# Patient Record
Sex: Male | Born: 1960 | Race: Black or African American | Hispanic: No | Marital: Married | Smoking: Former smoker
Health system: Southern US, Community
[De-identification: ages and names within clinical notes are randomized; demographics above are authoritative.]

## PROBLEM LIST (undated history)

## (undated) DIAGNOSIS — R0789 Other chest pain: Secondary | ICD-10-CM

## (undated) HISTORY — PX: SHOULDER SURGERY: SHX246

## (undated) HISTORY — DX: Other chest pain: R07.89

---

## 2001-06-02 HISTORY — PX: OTHER SURGICAL HISTORY: SHX169

## 2015-09-06 DIAGNOSIS — J111 Influenza due to unidentified influenza virus with other respiratory manifestations: Secondary | ICD-10-CM | POA: Diagnosis not present

## 2015-09-06 DIAGNOSIS — R509 Fever, unspecified: Secondary | ICD-10-CM | POA: Diagnosis not present

## 2017-02-13 ENCOUNTER — Emergency Department (HOSPITAL_BASED_OUTPATIENT_CLINIC_OR_DEPARTMENT_OTHER): Payer: BLUE CROSS/BLUE SHIELD

## 2017-02-13 ENCOUNTER — Emergency Department (HOSPITAL_BASED_OUTPATIENT_CLINIC_OR_DEPARTMENT_OTHER)
Admission: EM | Admit: 2017-02-13 | Discharge: 2017-02-13 | Disposition: A | Discharge status: Home / Self Care | Payer: BLUE CROSS/BLUE SHIELD | Attending: Emergency Medicine | Admitting: Emergency Medicine

## 2017-02-13 ENCOUNTER — Encounter (HOSPITAL_BASED_OUTPATIENT_CLINIC_OR_DEPARTMENT_OTHER): Payer: Self-pay | Admitting: *Deleted

## 2017-02-13 DIAGNOSIS — R1013 Epigastric pain: Secondary | ICD-10-CM

## 2017-02-13 DIAGNOSIS — F17200 Nicotine dependence, unspecified, uncomplicated: Secondary | ICD-10-CM | POA: Diagnosis not present

## 2017-02-13 DIAGNOSIS — R05 Cough: Secondary | ICD-10-CM | POA: Diagnosis not present

## 2017-02-13 DIAGNOSIS — R072 Precordial pain: Secondary | ICD-10-CM

## 2017-02-13 DIAGNOSIS — R079 Chest pain, unspecified: Secondary | ICD-10-CM | POA: Diagnosis not present

## 2017-02-13 DIAGNOSIS — R1010 Upper abdominal pain, unspecified: Secondary | ICD-10-CM | POA: Diagnosis not present

## 2017-02-13 DIAGNOSIS — K760 Fatty (change of) liver, not elsewhere classified: Secondary | ICD-10-CM | POA: Diagnosis not present

## 2017-02-13 LAB — CBC WITH DIFFERENTIAL/PLATELET
BASOS ABS: 0 10*3/uL (ref 0.0–0.1)
BASOS PCT: 0 %
EOS PCT: 5 %
Eosinophils Absolute: 0.2 10*3/uL (ref 0.0–0.7)
HCT: 38.4 % — ABNORMAL LOW (ref 39.0–52.0)
Hemoglobin: 13.1 g/dL (ref 13.0–17.0)
Lymphocytes Relative: 40 %
Lymphs Abs: 1.9 10*3/uL (ref 0.7–4.0)
MCH: 29.8 pg (ref 26.0–34.0)
MCHC: 34.1 g/dL (ref 30.0–36.0)
MCV: 87.3 fL (ref 78.0–100.0)
MONO ABS: 0.5 10*3/uL (ref 0.1–1.0)
Monocytes Relative: 11 %
Neutro Abs: 2 10*3/uL (ref 1.7–7.7)
Neutrophils Relative %: 44 %
PLATELETS: 244 10*3/uL (ref 150–400)
RBC: 4.4 MIL/uL (ref 4.22–5.81)
RDW: 13.1 % (ref 11.5–15.5)
WBC: 4.6 10*3/uL (ref 4.0–10.5)

## 2017-02-13 LAB — COMPREHENSIVE METABOLIC PANEL
ALT: 22 U/L (ref 17–63)
AST: 28 U/L (ref 15–41)
Albumin: 3.8 g/dL (ref 3.5–5.0)
Alkaline Phosphatase: 81 U/L (ref 38–126)
Anion gap: 5 (ref 5–15)
BILIRUBIN TOTAL: 0.6 mg/dL (ref 0.3–1.2)
BUN: 16 mg/dL (ref 6–20)
CO2: 27 mmol/L (ref 22–32)
Calcium: 9.3 mg/dL (ref 8.9–10.3)
Chloride: 105 mmol/L (ref 101–111)
Creatinine, Ser: 1.25 mg/dL — ABNORMAL HIGH (ref 0.61–1.24)
GFR calc Af Amer: >60 mL/min (ref >60)
GFR calc non Af Amer: >60 mL/min (ref >60)
Glucose, Bld: 130 mg/dL — ABNORMAL HIGH (ref 65–99)
POTASSIUM: 4.1 mmol/L (ref 3.5–5.1)
SODIUM: 137 mmol/L (ref 135–145)
TOTAL PROTEIN: 6.5 g/dL (ref 6.5–8.1)

## 2017-02-13 LAB — LIPASE, BLOOD: LIPASE: 30 U/L (ref 11–51)

## 2017-02-13 LAB — TROPONIN I

## 2017-02-13 MED ORDER — FAMOTIDINE 20 MG PO TABS
20.0000 mg | ORAL_TABLET | Freq: Once | ORAL | Status: AC
  Administered 2017-02-13: 20 mg via ORAL
  Filled 2017-02-13: qty 1

## 2017-02-13 MED ORDER — ACETAMINOPHEN 500 MG PO TABS
1000.0000 mg | ORAL_TABLET | Freq: Once | ORAL | Status: AC
Start: 2017-02-13 — End: 2017-02-13
  Administered 2017-02-13: 1000 mg via ORAL
  Filled 2017-02-13: qty 2

## 2017-02-13 MED ORDER — PANTOPRAZOLE SODIUM 40 MG PO TBEC: 40.0000 mg | DELAYED_RELEASE_TABLET | Freq: Every day | ORAL | 0 refills | Status: DC

## 2017-02-13 NOTE — ED Notes (Signed)
Awaiting US.

## 2017-02-13 NOTE — ED Provider Notes (Signed)
[facility] Provider Note   CSN: [id number] Arrival date & time: 02/13/17  7829     History   Chief Complaint Chief Complaint  Patient presents with  . Abdominal Pain    HPI Travis Chang is a 56 y.o. male.  Patient c/o upper abd, lower chest pain for the past 4 days. Pain constant, dull, moderate, occasionally radiates towards back. Occurs at rest. Is constant. If anything, worse w laying flat. No relation to eating. No relation to activity or exertion. Pain is in midline, not pleuritic. No associated nv or diaphoresis. Thoughts may be reflux related, tried tums without significant relief. Denies cough or uri c/o. No fever or chills. No chest wall injury or strain. No associated sob or unusual doe. Denies leg pain or swelling. No fam hx premature cad. No fam hx gallstones. Pt denies personal hx pud, gallstones or pancreatitis.    The history is provided by the patient.  Abdominal Pain   Pertinent negatives include fever, diarrhea, vomiting and headaches.    History reviewed. No pertinent past medical history.  There are no active problems to display for this patient.   Past Surgical History:  Procedure Laterality Date  . SHOULDER SURGERY Right        Home Medications    Prior to Admission medications   Not on File    Family History History reviewed. No pertinent family history.  Social History Social History  Substance Use Topics  . Smoking status: Current Every Day Smoker  . Smokeless tobacco: Never Used  . Alcohol use No     Allergies   Patient has no known allergies.   Review of Systems Review of Systems  Constitutional: Negative for fever.  HENT: Negative for sore throat.   Eyes: Negative for redness.  Respiratory: Negative for cough and shortness of breath.   Cardiovascular: Negative for chest pain.  Gastrointestinal: Positive for abdominal pain. Negative for diarrhea and vomiting.  Genitourinary: Negative for flank pain.    Musculoskeletal: Negative for neck pain.  Skin: Negative for rash.  Neurological: Negative for headaches.  Hematological: Does not bruise/bleed easily.  Psychiatric/Behavioral: Negative for confusion.     Physical Exam Updated Vital Signs BP 105/78   Pulse (!) 54   Temp 98.2 F (36.8 C) (Oral)   Resp 19   Ht 1.689 m (5' 6.5")   Wt 88.9 kg (196 lb)   SpO2 96%   BMI 31.16 kg/m   Physical Exam  Constitutional: He appears well-developed and well-nourished. No distress.  HENT:  Mouth/Throat: Oropharynx is clear and moist.  Eyes: Conjunctivae are normal.  Neck: Neck supple. No tracheal deviation present.  Cardiovascular: Normal rate, regular rhythm, normal heart sounds and intact distal pulses.   Pulmonary/Chest: Effort normal and breath sounds normal. No accessory muscle usage. No respiratory distress.  Abdominal: Soft. Bowel sounds are normal. He exhibits no distension. There is tenderness.  Mild epigastric tenderness  Genitourinary:  Genitourinary Comments: No cva tenderness  Musculoskeletal: He exhibits no edema or tenderness.  Neurological: He is alert.  Skin: Skin is warm and dry. He is not diaphoretic.  Psychiatric: He has a normal mood and affect.  Nursing note and vitals reviewed.    ED Treatments / Results  Labs (all labs ordered are listed, but only abnormal results are displayed) Results for orders placed or performed during the hospital encounter of 02/13/17  CBC with Differential  Result Value Ref Range   WBC 4.6 4.0 - 10.5 K/uL  RBC 4.40 4.22 - 5.81 MIL/uL   Hemoglobin 13.1 13.0 - 17.0 g/dL   HCT 10.2 (L) 72.5 - 36.6 %   MCV 87.3 78.0 - 100.0 fL   MCH 29.8 26.0 - 34.0 pg   MCHC 34.1 30.0 - 36.0 g/dL   RDW 44.0 34.7 - 42.5 %   Platelets 244 150 - 400 K/uL   Neutrophils Relative % 44 %   Neutro Abs 2.0 1.7 - 7.7 K/uL   Lymphocytes Relative 40 %   Lymphs Abs 1.9 0.7 - 4.0 K/uL   Monocytes Relative 11 %   Monocytes Absolute 0.5 0.1 - 1.0 K/uL    Eosinophils Relative 5 %   Eosinophils Absolute 0.2 0.0 - 0.7 K/uL   Basophils Relative 0 %   Basophils Absolute 0.0 0.0 - 0.1 K/uL   Dg Chest 2 View  Result Date: 02/13/2017 CLINICAL DATA:  Mid to lower chest pain. Cough and shortness of breath. EXAM: CHEST  2 VIEW COMPARISON:  None. FINDINGS: The cardiomediastinal contours are normal. The lungs are clear. Pulmonary vasculature is normal. No consolidation, pleural effusion, or pneumothorax. No acute osseous abnormalities are seen. IMPRESSION: No acute pulmonary process. Electronically Signed   By: Rubye Oaks M.D.   On: 02/13/2017 06:52    EKG  EKG Interpretation  Date/Time:  Friday February 13 2017 06:34:50 EDT Ventricular Rate:  60 PR Interval:    QRS Duration: 99 QT Interval:  399 QTC Calculation: 399 R Axis:   13 Text Interpretation:  Sinus rhythm Nonspecific ST abnormality Nonspecific T wave abnormality No previous tracing Confirmed by Cathren Laine ([id number]) on 02/13/2017 6:52:20 AM       Radiology Dg Chest 2 View  Result Date: 02/13/2017 CLINICAL DATA:  Mid to lower chest pain. Cough and shortness of breath. EXAM: CHEST  2 VIEW COMPARISON:  None. FINDINGS: The cardiomediastinal contours are normal. The lungs are clear. Pulmonary vasculature is normal. No consolidation, pleural effusion, or pneumothorax. No acute osseous abnormalities are seen. IMPRESSION: No acute pulmonary process. Electronically Signed   By: Rubye Oaks M.D.   On: 02/13/2017 06:52    Procedures Procedures (including critical care time)  Medications Ordered in ED Medications  famotidine (PEPCID) tablet 20 mg (20 mg Oral Given 02/13/17 0709)  acetaminophen (TYLENOL) tablet 1,000 mg (1,000 mg Oral Given 02/13/17 0709)     Initial Impression / Assessment and Plan / ED Course  I have reviewed the triage vital signs and the nursing notes.  Pertinent labs & imaging results that were available during my care of the patient were reviewed by me and  considered in my medical decision making (see chart for details).  Labs.   Will get u/s.  Reviewed nursing notes and prior charts for additional history.  No prior ED eval for same.   Acetaminophen and pepcid given for symptom relief.   After constant symptoms x 3-4 days, trop is negative - symptoms do not appear c/w acs.  Recheck, pt comfortable. No pain. No sob.   Given symptoms, fam hx, will have f/u cardiology as outpt.   Return precautions provided.   Patient current appears stable for d/c.     Final Clinical Impressions(s) / ED Diagnoses   Final diagnoses:  None    New Prescriptions New Prescriptions   No medications on file     Cathren Laine, MD 02/13/17 1007

## 2017-02-13 NOTE — ED Notes (Signed)
Patient transported to Ultrasound 

## 2017-02-13 NOTE — ED Notes (Signed)
Pt to xray

## 2017-02-13 NOTE — Discharge Instructions (Signed)
It was our pleasure to provide your ER care today - we hope that you feel better.  Take protonix (acid blocker medication).  If Gi symptoms, you may also try pepcid or maalox for symptom relief.  For chest discomfort, follow up with cardiologist in the coming week - see referral - call office to arrange appointment.   Return to ER if worse, new symptoms, recurrent or persistent chest pain, trouble breathing, new or severe abdominal pain, other concern.

## 2017-02-13 NOTE — ED Notes (Addendum)
C/o epigastric abd pain, also chest and back, onset Tuesday, no relief with TUMs or Goodies powder, describes as constant, worse at night. Also mentions intermittent cold sweat and dizziness. (Denies: NVD, fever, sob, bleeding, sore throat, cough, cold sx, recent illness or other sx). Pt of Dr. Lynett Grimes at [facility]. Last ate 2100. Last BM yesterday (normal).

## 2017-02-13 NOTE — ED Notes (Signed)
Back from xray, no changes.  ?

## 2017-02-13 NOTE — ED Notes (Signed)
Alert, NAD, calm, interactive, resps e/u, speaking in clear complete sentences, no dyspnea noted, steady gait from registration to exam room, EKG in progress. Family at [facility].

## 2017-02-13 NOTE — ED Notes (Signed)
Dr. Steinl into room 

## 2017-02-18 ENCOUNTER — Ambulatory Visit (INDEPENDENT_AMBULATORY_CARE_PROVIDER_SITE_OTHER): Payer: BLUE CROSS/BLUE SHIELD | Admitting: Cardiovascular Disease

## 2017-02-18 ENCOUNTER — Encounter: Payer: Self-pay | Admitting: Cardiovascular Disease

## 2017-02-18 VITALS — BP 130/80 | HR 72 | Ht 66.5 in | Wt 191.4 lb

## 2017-02-18 DIAGNOSIS — R079 Chest pain, unspecified: Secondary | ICD-10-CM | POA: Diagnosis not present

## 2017-02-18 DIAGNOSIS — R0789 Other chest pain: Secondary | ICD-10-CM | POA: Diagnosis not present

## 2017-02-18 DIAGNOSIS — Z13220 Encounter for screening for lipoid disorders: Secondary | ICD-10-CM | POA: Diagnosis not present

## 2017-02-18 DIAGNOSIS — Z8249 Family history of ischemic heart disease and other diseases of the circulatory system: Secondary | ICD-10-CM | POA: Diagnosis not present

## 2017-02-18 NOTE — Patient Instructions (Addendum)
Medication Instructions:  Your physician recommends that you continue on your current medications as directed. Please refer to the Current Medication list given to you today.   Labwork: FASTING LP SOON   Testing/Procedures:  CORONARY CTA   Follow-Up: Your physician recommends that you schedule a follow-up appointment in: 1 MONTH OV WITH DR [facility] [place]     Cardiac CT Angiogram A cardiac CT angiogram is a procedure to look at the heart and the area around the heart. It may be done to help find the cause of chest pains or other symptoms of heart disease. During this procedure, a large X-ray machine, called a CT scanner, takes detailed pictures of the heart and the surrounding area after a dye (contrast material) has been injected into blood vessels in the area. The procedure is also sometimes called a coronary CT angiogram, coronary artery scanning, or CTA. A cardiac CT angiogram allows the health care provider to see how well blood is flowing to and from the heart. The health care provider will be able to see if there are any problems, such as:  Blockage or narrowing of the coronary arteries in the heart.  Fluid around the heart.  Signs of weakness or disease in the muscles, valves, and tissues of the heart.  Tell a health care provider about:  Any allergies you have. This is especially important if you have had a previous allergic reaction to contrast dye.  All medicines you are taking, including vitamins, herbs, eye drops, creams, and over-the-counter medicines.  Any blood disorders you have.  Any surgeries you have had.  Any medical conditions you have.  Whether you are pregnant or may be pregnant.  Any anxiety disorders, chronic pain, or other conditions you have that may increase your stress or prevent you from lying still. What are the risks? Generally, this is a safe procedure. However, problems may occur, including:  Bleeding.  Infection.  Allergic reactions to  medicines or dyes.  Damage to other structures or organs.  Kidney damage from the dye or contrast that is used.  Increased risk of cancer from radiation exposure. This risk is low. Talk with your health care provider about: ? The risks and benefits of testing. ? How you can receive the lowest dose of radiation.  What happens before the procedure?  Wear comfortable clothing and remove any jewelry, glasses, dentures, and hearing aids.  Follow instructions from your health care provider about eating and drinking. This may include: ? For 12 hours before the test - avoid caffeine. This includes tea, coffee, soda, energy drinks, and diet pills. Drink plenty of water or other fluids that do not have caffeine in them. Being well-hydrated can prevent complications. ? For 4-6 hours before the test - stop eating and drinking. The contrast dye can cause nausea, but this is less likely if your stomach is empty.  Ask your health care provider about changing or stopping your regular medicines. This is especially important if you are taking diabetes medicines, blood thinners, or medicines to treat erectile dysfunction. What happens during the procedure?  Hair on your chest may need to be removed so that small sticky patches called electrodes can be placed on your chest. These will transmit information that helps to monitor your heart during the test.  An IV tube will be inserted into one of your veins.  You might be given a medicine to control your heart rate during the test. This will help to ensure that good images  are obtained.  You will be asked to lie on an exam table. This table will slide in and out of the CT machine during the procedure.  Contrast dye will be injected into the IV tube. You might feel warm, or you may get a metallic taste in your mouth.  You will be given a medicine (nitroglycerin) to relax (dilate) the arteries in your heart.  The table that you are lying on will move into  the CT machine tunnel for the scan.  The person running the machine will give you instructions while the scans are being done. You may be asked to: ? Keep your arms above your head. ? Hold your breath. ? Stay very still, even if the table is moving.  When the scanning is complete, you will be moved out of the machine.  The IV tube will be removed. The procedure may vary among health care providers and hospitals. What happens after the procedure?  You might feel warm, or you may get a metallic taste in your mouth from the contrast dye.  You may have a headache from the nitroglycerin.  After the procedure, drink water or other fluids to wash (flush) the contrast material out of your body.  Contact a health care provider if you have any symptoms of allergy to the contrast. These symptoms include: ? Shortness of breath. ? Rash or hives. ? A racing heartbeat.  Most people can return to their normal activities right after the procedure. Ask your health care provider what activities are safe for you.  It is up to you to get the results of your procedure. Ask your health care provider, or the department that is doing the procedure, when your results will be ready. Summary  A cardiac CT angiogram is a procedure to look at the heart and the area around the heart. It may be done to help find the cause of chest pains or other symptoms of heart disease.  During this procedure, a large X-ray machine, called a CT scanner, takes detailed pictures of the heart and the surrounding area after a dye (contrast material) has been injected into blood vessels in the area.  Ask your health care provider about changing or stopping your regular medicines before the procedure. This is especially important if you are taking diabetes medicines, blood thinners, or medicines to treat erectile dysfunction.  After the procedure, drink water or other fluids to wash (flush) the contrast material out of your  body. This information is not intended to replace advice given to you by your health care provider. Make sure you discuss any questions you have with your health care provider. Document Released: 05/01/2008 Document Revised: 04/07/2016 Document Reviewed: 04/07/2016 Elsevier Interactive Patient Education  2017 [employer].

## 2017-02-18 NOTE — Progress Notes (Signed)
Cardiology Office Note   Date:  02/19/2017   ID:  Travis Chang, DOB 12-10-1960, MRN [id number]  PCP:  Jolene Provost, MD  Cardiologist:   Chilton Si, MD   Chief Complaint  Patient presents with  . Chest Pain      History of Present Illness: Travis Chang is a 56 y.o. male who presents for an evaluation of chest pain.  He was seen in the ED 02/13/17 for abdominal and chest pain.  Cardiac enzymes were negative after several days of symptoms and no ischemic changes on EKG.  However, given his family history he was referred to cardiology for evaluation as an outpatient. He reports chest tightness that occurs when lying down.  It is sharp and associated with shortness of breath.  There is no associated nausea or diaphoresis.  Tylenol is sometimes helpful.  In the ED he was prescribed pantoprazole, which has not been helpful.  Mr. Sequeira has a strenuous job and sometimes get short of breath while working.  He denies lower extremity edema, orthopnea or PND.  He has a 55 year old son and avoids running around with him due to fear of shortness of breath.  He denies exertional chest pain.    Mr. Finis Bud quit smoking in 2017 after smoking 2 packs per week for 15 years.  His father had a heart attack at age 58 and his sister recently died of a heart attack at age 56.    Past Medical History:  Diagnosis Date  . Atypical chest pain 02/19/2017    Past Surgical History:  Procedure Laterality Date  . kidney stones  2003  . SHOULDER SURGERY Right      Current Outpatient Prescriptions  Medication Sig Dispense Refill  . pantoprazole (PROTONIX) 40 MG tablet Take 1 tablet (40 mg total) by mouth daily. 30 tablet 0   No current facility-administered medications for this visit.     Allergies:   Patient has no known allergies.    Social History:  The patient  reports that he has quit smoking. He has never used smokeless tobacco. He reports that he drinks alcohol. He  reports that he does not use drugs.   Family History:  The patient's family history includes Congestive Heart Failure in his mother; Diabetes in his brother and mother; Heart attack in his father and sister; Hypertension in his brother, mother, and sister.    ROS:  Please see the history of present illness.   Otherwise, review of systems are positive for none.   All other systems are reviewed and negative.    PHYSICAL EXAM: VS:  BP 130/80 (BP Location: Left Arm, Cuff Size: Normal)   Pulse 72   Ht 5' 6.5" (1.689 m)   Wt 86.8 kg (191 lb 6.4 oz)   BMI 30.43 kg/m  , BMI Body mass index is 30.43 kg/m. GENERAL:  Well appearing HEENT:  Pupils equal round and reactive, fundi not visualized, oral mucochessa unremarkable NECK:  No jugular venous distention, waveform within normal limits, carotid upstroke brisk and symmetric, no bruits, no thyromegaly LYMPHATICS:  No cervical adenopathy LUNGS:  Clear to auscultation bilaterally HEART:  RRR.  PMI not displaced or sustained,S1 and S2 within normal limits, no S3, no S4, no clicks, no rubs, no murmurs ABD:  Flat, positive bowel sounds normal in frequency in pitch, no bruits, no rebound, no guarding, no midline pulsatile mass, no hepatomegaly, no splenomegaly EXT:  2 plus pulses throughout, no edema, no cyanosis no  clubbing SKIN:  No rashes no nodules NEURO:  Cranial nerves II through XII grossly intact, motor grossly intact throughout PSYCH:  Cognitively intact, oriented to person place and time    EKG:  EKG is not ordered today. The ekg ordered 02/16/17 demonstrates sinus rhythm.  Rate 60 bpm.  Early repolarization abnormalities.    Recent Labs: 02/13/2017: ALT 22; BUN 16; Creatinine, Ser 1.25; Hemoglobin 13.1; Platelets 244; Potassium 4.1; Sodium 137    Lipid Panel No results found for: CHOL, TRIG, HDL, CHOLHDL, VLDL, LDLCALC, LDLDIRECT    Wt Readings from Last 3 Encounters:  02/18/17 86.8 kg (191 lb 6.4 oz)  02/13/17 88.9 kg (196 lb)       ASSESSMENT AND PLAN:  # Atypical chest pain: Symptoms are atypical, but haven't improved with treatment of GERD and he has a significant family history of CAD.  We will get a coronary CT-A and check fasting lipids.  OK to go back to work if CT-A is negative.  We will also check a fasting lipid panel.    Current medicines are reviewed at length with the patient today.  The patient does not have concerns regarding medicines.  The following changes have been made:  no change  Labs/ tests ordered today include:    Orders Placed This Encounter  Procedures  . CT CORONARY MORPH W/CTA COR W/SCORE W/CA W/CM &/OR WO/CM  . CT CORONARY FRACTIONAL FLOW RESERVE DATA PREP  . CT CORONARY FRACTIONAL FLOW RESERVE FLUID ANALYSIS  . Lipid panel     Disposition:   FU with Zineb Glade C. Duke Salvia, MD, [facility] or APP in 1 month.     This note was written with the assistance of speech recognition software.  Please excuse any transcriptional errors.  Signed, Shamia Uppal C. Duke Salvia, MD, [facility]  02/19/2017 10:58 AM    [facility]

## 2017-02-19 ENCOUNTER — Encounter: Payer: Self-pay | Admitting: Cardiovascular Disease

## 2017-02-19 DIAGNOSIS — Z8249 Family history of ischemic heart disease and other diseases of the circulatory system: Secondary | ICD-10-CM | POA: Diagnosis not present

## 2017-02-19 DIAGNOSIS — R079 Chest pain, unspecified: Secondary | ICD-10-CM | POA: Diagnosis not present

## 2017-02-19 DIAGNOSIS — Z13220 Encounter for screening for lipoid disorders: Secondary | ICD-10-CM | POA: Diagnosis not present

## 2017-02-19 DIAGNOSIS — R0789 Other chest pain: Secondary | ICD-10-CM

## 2017-02-19 HISTORY — DX: Other chest pain: R07.89

## 2017-02-19 LAB — LIPID PANEL
Chol/HDL Ratio: 4.2 ratio (ref 0.0–5.0)
Cholesterol, Total: 179 mg/dL (ref 100–199)
HDL: 43 mg/dL (ref >39)
LDL Calculated: 118 mg/dL — ABNORMAL HIGH (ref 0–99)
Triglycerides: 89 mg/dL (ref 0–149)
VLDL Cholesterol Cal: 18 mg/dL (ref 5–40)

## 2017-02-24 ENCOUNTER — Ambulatory Visit (HOSPITAL_COMMUNITY)
Admission: RE | Admit: 2017-02-24 | Discharge: 2017-02-24 | Disposition: A | Discharge status: Home / Self Care | Payer: BLUE CROSS/BLUE SHIELD | Source: Ambulatory Visit | Attending: Cardiovascular Disease | Admitting: Cardiovascular Disease

## 2017-02-24 DIAGNOSIS — R0789 Other chest pain: Secondary | ICD-10-CM | POA: Diagnosis not present

## 2017-02-24 DIAGNOSIS — Z8249 Family history of ischemic heart disease and other diseases of the circulatory system: Secondary | ICD-10-CM

## 2017-02-24 DIAGNOSIS — R079 Chest pain, unspecified: Secondary | ICD-10-CM | POA: Diagnosis not present

## 2017-02-24 DIAGNOSIS — I313 Pericardial effusion (noninflammatory): Secondary | ICD-10-CM | POA: Insufficient documentation

## 2017-02-24 MED ORDER — IOPAMIDOL (ISOVUE-370) INJECTION 76%
INTRAVENOUS | Status: AC
  Administered 2017-02-24: 80 mL via INTRAVENOUS
  Filled 2017-02-24: qty 100

## 2017-02-24 MED ORDER — NITROGLYCERIN 0.4 MG SL SUBL
0.8000 mg | SUBLINGUAL_TABLET | Freq: Once | SUBLINGUAL | Status: AC
  Administered 2017-02-24: 0.8 mg via SUBLINGUAL

## 2017-02-24 MED ORDER — NITROGLYCERIN 0.4 MG SL SUBL
SUBLINGUAL_TABLET | SUBLINGUAL | Status: AC
  Filled 2017-02-24: qty 2

## 2017-02-24 MED ORDER — METOPROLOL TARTRATE 5 MG/5ML IV SOLN
INTRAVENOUS | Status: AC
  Filled 2017-02-24: qty 5

## 2017-02-24 MED ORDER — METOPROLOL TARTRATE 5 MG/5ML IV SOLN
5.0000 mg | Freq: Once | INTRAVENOUS | Status: AC
  Administered 2017-02-24: 5 mg via INTRAVENOUS

## 2017-02-24 NOTE — Progress Notes (Signed)
CT scan completed. Tolerated well. D/C home walking. Awake and alert. In no distress. 

## 2017-02-26 ENCOUNTER — Telehealth: Payer: Self-pay | Admitting: *Deleted

## 2017-02-26 DIAGNOSIS — R079 Chest pain, unspecified: Secondary | ICD-10-CM

## 2017-02-26 NOTE — Telephone Encounter (Signed)
-----   Message from Chilton Si, MD sent at 02/24/2017  9:53 PM EDT ----- The left coronary artery takes of from a slight abnormal place.  We need to get an exercise Myoview to see if there are any signs that it is being compressed by the heart.

## 2017-02-26 NOTE — Telephone Encounter (Signed)
Notes recorded by Regis Bill B on 02/26/2017 at 6:45 PM EDT Advised patient and will send to scheduling to arrange

## 2017-03-02 ENCOUNTER — Telehealth: Payer: Self-pay | Admitting: Cardiovascular Disease

## 2017-03-02 NOTE — Telephone Encounter (Signed)
Patient brought [employer] Disability Claim/FMLA Form to office for Dr Duke Salvia to complete and sign.  Received signed AUTH/Money Order and [employer] Form.  Sent to [employer] on 03/02/17 for processing.  Sent via Courier. lp

## 2017-03-03 ENCOUNTER — Telehealth: Payer: Self-pay | Admitting: Cardiovascular Disease

## 2017-03-03 NOTE — Telephone Encounter (Signed)
Received [employer] Forms (FMLA/Disability) from [employer].  Forms given to Dr Duke Salvia to review, complete and sign.  lp

## 2017-03-04 ENCOUNTER — Telehealth: Payer: Self-pay | Admitting: Cardiovascular Disease

## 2017-03-04 NOTE — Telephone Encounter (Signed)
Scheduled patient for a 2 day nuclear study on 03-12-17 and 03-13-17.  Instructions and billing instruction sheet with calendar mailed to the patient today.

## 2017-03-06 ENCOUNTER — Telehealth: Payer: Self-pay | Admitting: Cardiovascular Disease

## 2017-03-06 NOTE — Telephone Encounter (Signed)
Received signed Disability/FMLA Forms back from Dr Duke Salvia.  Forms-[employer] sent to [employer] @ [address] [employer] to distribute forms and records.  Sent via Courier on 03/06/17. lp

## 2017-03-10 ENCOUNTER — Telehealth (HOSPITAL_COMMUNITY): Payer: Self-pay

## 2017-03-10 NOTE — Telephone Encounter (Signed)
Encounter complete. 

## 2017-03-12 ENCOUNTER — Ambulatory Visit (HOSPITAL_COMMUNITY)
Admission: RE | Admit: 2017-03-12 | Discharge: 2017-03-12 | Disposition: A | Discharge status: Home / Self Care | Payer: BLUE CROSS/BLUE SHIELD | Source: Ambulatory Visit | Attending: Cardiovascular Disease | Admitting: Cardiovascular Disease

## 2017-03-12 DIAGNOSIS — R079 Chest pain, unspecified: Secondary | ICD-10-CM | POA: Diagnosis not present

## 2017-03-12 MED ORDER — TECHNETIUM TC 99M TETROFOSMIN IV KIT
29.4000 | PACK | Freq: Once | INTRAVENOUS | Status: AC | PRN
  Administered 2017-03-12: 29.4 via INTRAVENOUS
  Filled 2017-03-12: qty 30

## 2017-03-13 ENCOUNTER — Ambulatory Visit (HOSPITAL_COMMUNITY)
Admission: RE | Admit: 2017-03-13 | Discharge: 2017-03-13 | Disposition: A | Discharge status: Home / Self Care | Payer: BLUE CROSS/BLUE SHIELD | Source: Ambulatory Visit | Attending: Cardiology | Admitting: Cardiology

## 2017-03-13 LAB — MYOCARDIAL PERFUSION IMAGING
Estimated workload: 9.5 METS
Exercise duration (min): 8 min
Exercise duration (sec): 36 s
LV dias vol: 110 mL (ref 62–150)
LV sys vol: 57 mL
MPHR: 166 {beats}/min
Peak HR: 157 {beats}/min
Percent HR: 94 %
RPE: 18
Rest HR: 57 {beats}/min
SDS: 0
SRS: 0
SSS: 0
TID: 0.98

## 2017-03-13 MED ORDER — TECHNETIUM TC 99M TETROFOSMIN IV KIT
29.3000 | PACK | Freq: Once | INTRAVENOUS | Status: AC | PRN
  Administered 2017-03-13: 29.3 via INTRAVENOUS

## 2017-03-17 ENCOUNTER — Telehealth: Payer: Self-pay | Admitting: *Deleted

## 2017-03-17 DIAGNOSIS — R931 Abnormal findings on diagnostic imaging of heart and coronary circulation: Secondary | ICD-10-CM

## 2017-03-17 NOTE — Telephone Encounter (Signed)
Advised patient, echo scheduled  He will call back with fax number for return to work letter or if he will come pick up

## 2017-03-23 ENCOUNTER — Encounter (INDEPENDENT_AMBULATORY_CARE_PROVIDER_SITE_OTHER): Payer: Self-pay

## 2017-03-23 ENCOUNTER — Other Ambulatory Visit: Payer: Self-pay

## 2017-03-23 ENCOUNTER — Ambulatory Visit (HOSPITAL_COMMUNITY): Payer: BLUE CROSS/BLUE SHIELD | Attending: Cardiology

## 2017-03-23 DIAGNOSIS — I071 Rheumatic tricuspid insufficiency: Secondary | ICD-10-CM | POA: Diagnosis not present

## 2017-03-23 DIAGNOSIS — R079 Chest pain, unspecified: Secondary | ICD-10-CM | POA: Diagnosis not present

## 2017-03-23 DIAGNOSIS — R931 Abnormal findings on diagnostic imaging of heart and coronary circulation: Secondary | ICD-10-CM | POA: Diagnosis not present

## 2017-03-30 ENCOUNTER — Encounter (INDEPENDENT_AMBULATORY_CARE_PROVIDER_SITE_OTHER): Payer: Self-pay

## 2017-03-30 ENCOUNTER — Ambulatory Visit (INDEPENDENT_AMBULATORY_CARE_PROVIDER_SITE_OTHER): Payer: BLUE CROSS/BLUE SHIELD | Admitting: Physician Assistant

## 2017-03-30 ENCOUNTER — Encounter: Payer: Self-pay | Admitting: Physician Assistant

## 2017-03-30 VITALS — BP 130/94 | HR 70 | Ht 66.0 in | Wt 200.4 lb

## 2017-03-30 DIAGNOSIS — R0789 Other chest pain: Secondary | ICD-10-CM | POA: Diagnosis not present

## 2017-03-30 DIAGNOSIS — Z8249 Family history of ischemic heart disease and other diseases of the circulatory system: Secondary | ICD-10-CM | POA: Diagnosis not present

## 2017-03-30 MED ORDER — ALIVE MENS ENERGY PO TABS: 1.0000 | ORAL_TABLET | Freq: Every morning | ORAL | Status: AC

## 2017-03-30 NOTE — Patient Instructions (Signed)
Medication Instructions:  NO CHANGES If you need a refill on your cardiac medications before your next appointment, please call your pharmacy.  Follow-Up: Your physician wants you to follow-up in: AS NEEDED.   Thank you [facility] choosing [facility]!!

## 2017-03-30 NOTE — Progress Notes (Signed)
Cardiology Office Note   Date:  03/30/2017   ID:  Travis Chang, DOB 1961/03/04, MRN [id number]  PCP:  Jolene ProvostHaimes, David M, MD  Cardiologist:  Dr Duke Salviaandolph 02/18/2017  Theodore DemarkBarrett, Rhonda, PA-C   Chief Complaint  Patient presents with  . Follow-up    pt hs no complaints     History of Present Illness: Travis Chang is a 56 y.o. male with a history of CP, s/p eval 01/2017 w/ cardiac CT>>?LAD dz>>functional study>>MV was ok  Travis Chang presents for cardiology follow up.   He feels he is doing much better. He has had no further episodes of chest pain. He started his mother on a vitamin/supplement called Alive, and she was doing so well on it that he started taking it.   Since being on the Alive, he has not had any more chest pain. He denies DOE, SOB, LE edema, orthopnea or PND.   His BP is up a little but he has no dx HTN, does not check his BP regularly.   He has stopped the Protonix, does not feel he needs it any more.  He is active at work, lifting as well as walking. No exertional sx.   He sometimes has LE pain w/ exertion.   Past Medical History:  Diagnosis Date  . Atypical chest pain 02/19/2017    Past Surgical History:  Procedure Laterality Date  . kidney stones  2003  . SHOULDER SURGERY Right     Current Outpatient Prescriptions  Medication Sig Dispense Refill  . Multiple Vitamins-Minerals (ALIVE MENS ENERGY) TABS Take 1 tablet by mouth every morning.     No current facility-administered medications for this visit.     Allergies:   Patient has no known allergies.    Social History:  The patient  reports that he has quit smoking. He has never used smokeless tobacco. He reports that he drinks alcohol. He reports that he does not use drugs.   Family History:  The patient's family history includes Congestive Heart Failure in his mother; Diabetes in his brother and mother; Heart attack in his father and sister; Hypertension in his brother,  mother, and sister.    ROS:  Please see the history of present illness. All other systems are reviewed and negative.    PHYSICAL EXAM: VS:  BP (!) 130/94   Pulse 70   Ht 5\' 6"  (1.676 m)   Wt 200 lb 6.4 oz (90.9 kg)   BMI 32.35 kg/m  , BMI Body mass index is 32.35 kg/m. GEN: Well nourished, well developed, male in no acute distress  HEENT: normal for age  Neck: no JVD, no carotid bruit, no masses Cardiac: RRR; no murmur, no rubs, or gallops Respiratory:  clear to auscultation bilaterally, normal work of breathing GI: soft, nontender, nondistended, + BS MS: no deformity or atrophy; no edema; distal pulses are 2+ in all 4 extremities   Skin: warm and dry, no rash Neuro:  Strength and sensation are intact Psych: euthymic mood, full affect   EKG:  EKG is not ordered today.  CARDIAC CT: 02/24/2017 IMPRESSION: 1. Coronary calcium score of 4. This was 50 percentile for age and sex matched control. 2. Right dominance. 3.  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 4. 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 A mild compression is seen in late systole,  however early systole was not imaged in this prospective scan. A functional stress test is recommended to evaluate for ischemia.  MYOVIEW: 03/13/2017  The left ventricular ejection fraction is mildly decreased (45-54%).  Nuclear stress EF: 48%.  The study is normal.  This is a low risk study.  Blood pressure demonstrated a hypertensive response to exercise.  There was no ST segment deviation noted during stress.  No T wave inversion was noted during stress.  Low risk stress nuclear study with normal perfusion and mildly reduced left ventricular global systolic function. Consider nonischemic (hypertensive?) cardiomyopathy.  ECHO: 03/23/2017 - Left ventricle: The cavity size was normal. There  was moderate   concentric hypertrophy. Systolic function was normal. The   estimated ejection fraction was in the range of 55% to 60%. Wall   motion was normal; there were no regional wall motion   abnormalities. Doppler parameters are consistent with abnormal   left ventricular relaxation (grade 1 diastolic dysfunction).   There was no evidence of elevated ventricular filling pressure by   Doppler parameters. - Aortic valve: Trileaflet; normal thickness leaflets. There was no   regurgitation. - Ascending aorta: The ascending aorta was normal in size. - Mitral valve: There was trivial regurgitation. - Right ventricle: Systolic function was normal. - Right atrium: The atrium was normal in size. - Tricuspid valve: There was mild regurgitation. - Pulmonary arteries: Systolic pressure was within the normal range. - Inferior vena cava: The vessel was normal in size. The   respirophasic diameter changes were in the normal range (= 50%),   consistent with normal central venous pressure. - Pericardium, extracardiac: There was no pericardial effusion.   Recent Labs: 02/13/2017: ALT 22; BUN 16; Creatinine, Ser 1.25; Hemoglobin 13.1; Platelets 244; Potassium 4.1; Sodium 137    Lipid Panel    Component Value Date/Time   CHOL 179 02/19/2017 0857   TRIG 89 02/19/2017 0857   HDL 43 02/19/2017 0857   CHOLHDL 4.2 02/19/2017 0857   LDLCALC 118 (H) 02/19/2017 0857     Wt Readings from Last 3 Encounters:  03/30/17 200 lb 6.4 oz (90.9 kg)  03/12/17 191 lb (86.6 kg)  02/18/17 191 lb 6.4 oz (86.8 kg)     Other studies Reviewed: Additional studies/ records that were reviewed today include: office notes and testing.  ASSESSMENT AND PLAN:  1.  Chest pain, FH CAD: Explained that he does not have significant blockages and his heart is not weak. No further testing needed. Encouraged him to keep his activity level up, heart-healthy lifestyle.  2. ?PAD: He sometimes gets LE pain but has excellent  distal pulses and sx are not consistent. Advised that if sx progress, call us.   Current medicines are reviewed at length with the patient today.  The patient does not have concerns regarding medicines.  The following changes have been made:  no change  Labs/ tests ordered today include:  No orders of the defined types were placed in this encounter.    Disposition:   FU with Dr Duke Salvia prn  Signed, Leanna Battles  03/30/2017 9:21 AM    [facility] Phone: [phone]; Fax: [phone]  This note was written with the assistance of speech recognition software. Please excuse any transcriptional errors.

## 2017-10-22 IMAGING — NM NM MISC PROCEDURE
9 series · 54 of 54 positions shown · non-contrast
Comparison: none

[Series 1: stress sax gs · 6.4mm · 6.40mm/px · 6 of 168 frames shown]
[frame 15/168]
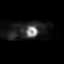
[frame 43/168]
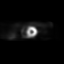
[frame 71/168]
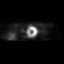
[frame 99/168]
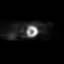
[frame 127/168]
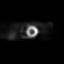
[frame 155/168]
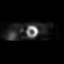

[Series 1: wbr_s-proj_st wbr stress-gsp · 6.40mm/px · 6 of 512 frames shown]
[frame 43/512]
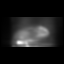
[frame 128/512]
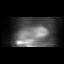
[frame 214/512]
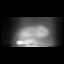
[frame 299/512]
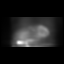
[frame 384/512]
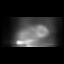
[frame 470/512]
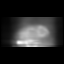

[Series 1: wbr stress-gsp · 6.40mm/px · 6 of 508 frames shown]
[frame 43/508]
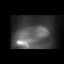
[frame 127/508]
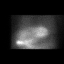
[frame 212/508]
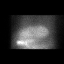
[frame 297/508]
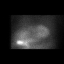
[frame 381/508]
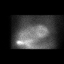
[frame 466/508]
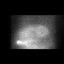

[Series 1: stress sax · 6.4mm · 6.40mm/px · 6 of 21 frames shown]
[frame 2/21]
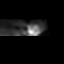
[frame 6/21]
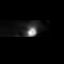
[frame 9/21]
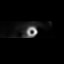
[frame 13/21]
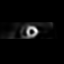
[frame 16/21]
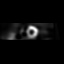
[frame 20/21]
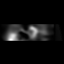

[Series 2: wbr_s-proj_st wbr stress-sum-em · 6.40mm/px · 6 of 64 frames shown]
[frame 6/64]
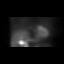
[frame 16/64]
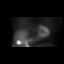
[frame 27/64]
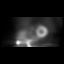
[frame 38/64]
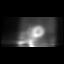
[frame 48/64]
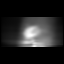
[frame 59/64]
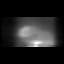

[Series 2: wbr stress-sum-em · 6.40mm/px · 6 of 64 frames shown]
[frame 6/64]
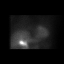
[frame 16/64]
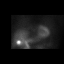
[frame 27/64]
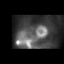
[frame 38/64]
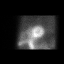
[frame 48/64]
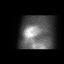
[frame 59/64]
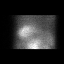

[Series 3: rest sax · 6.4mm · 6.40mm/px · 6 of 21 frames shown]
[frame 2/21]
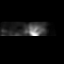
[frame 6/21]
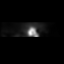
[frame 9/21]
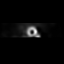
[frame 13/21]
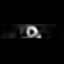
[frame 16/21]
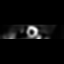
[frame 20/21]
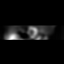

[Series 3: wbr_r-proj_st wbr rest · 6.40mm/px · 6 of 64 frames shown]
[frame 6/64]
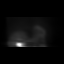
[frame 16/64]
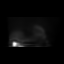
[frame 27/64]
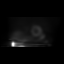
[frame 38/64]
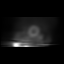
[frame 48/64]
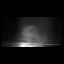
[frame 59/64]
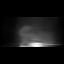

[Series 3: wbr rest · 6.40mm/px · 6 of 64 frames shown]
[frame 6/64]
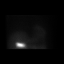
[frame 16/64]
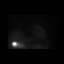
[frame 27/64]
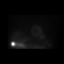
[frame 38/64]
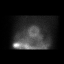
[frame 48/64]
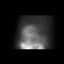
[frame 59/64]
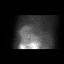

[54 of 54 positions shown; findings below may reference images not displayed]

Canned report from images found in remote index.

Refer to host system for actual result text.

## 2020-12-30 ENCOUNTER — Emergency Department (HOSPITAL_BASED_OUTPATIENT_CLINIC_OR_DEPARTMENT_OTHER)
Admission: EM | Admit: 2020-12-30 | Discharge: 2020-12-30 | Disposition: A | Discharge status: Home / Self Care | Payer: BC Managed Care – PPO | Attending: Emergency Medicine | Admitting: Emergency Medicine

## 2020-12-30 ENCOUNTER — Other Ambulatory Visit: Payer: Self-pay

## 2020-12-30 ENCOUNTER — Emergency Department (HOSPITAL_BASED_OUTPATIENT_CLINIC_OR_DEPARTMENT_OTHER): Payer: BC Managed Care – PPO

## 2020-12-30 ENCOUNTER — Encounter (HOSPITAL_BASED_OUTPATIENT_CLINIC_OR_DEPARTMENT_OTHER): Payer: Self-pay | Admitting: Emergency Medicine

## 2020-12-30 DIAGNOSIS — M791 Myalgia, unspecified site: Secondary | ICD-10-CM | POA: Diagnosis not present

## 2020-12-30 DIAGNOSIS — R1084 Generalized abdominal pain: Secondary | ICD-10-CM

## 2020-12-30 DIAGNOSIS — R079 Chest pain, unspecified: Secondary | ICD-10-CM | POA: Insufficient documentation

## 2020-12-30 DIAGNOSIS — Z87891 Personal history of nicotine dependence: Secondary | ICD-10-CM | POA: Diagnosis not present

## 2020-12-30 DIAGNOSIS — R059 Cough, unspecified: Secondary | ICD-10-CM | POA: Diagnosis not present

## 2020-12-30 DIAGNOSIS — R519 Headache, unspecified: Secondary | ICD-10-CM | POA: Diagnosis not present

## 2020-12-30 LAB — LIPASE, BLOOD: Lipase: 35 U/L (ref 11–51)

## 2020-12-30 LAB — COMPREHENSIVE METABOLIC PANEL
ALT: 36 U/L (ref 0–44)
AST: 30 U/L (ref 15–41)
Albumin: 4.4 g/dL (ref 3.5–5.0)
Alkaline Phosphatase: 94 U/L (ref 38–126)
Anion gap: 7 (ref 5–15)
BUN: 22 mg/dL — ABNORMAL HIGH (ref 6–20)
CO2: 30 mmol/L (ref 22–32)
Calcium: 9.6 mg/dL (ref 8.9–10.3)
Chloride: 101 mmol/L (ref 98–111)
Creatinine, Ser: 1.19 mg/dL (ref 0.61–1.24)
GFR, Estimated: >60 mL/min (ref >60)
Glucose, Bld: 123 mg/dL — ABNORMAL HIGH (ref 70–99)
Potassium: 4.5 mmol/L (ref 3.5–5.1)
Sodium: 138 mmol/L (ref 135–145)
Total Bilirubin: 0.4 mg/dL (ref 0.3–1.2)
Total Protein: 7.7 g/dL (ref 6.5–8.1)

## 2020-12-30 LAB — URINALYSIS, ROUTINE W REFLEX MICROSCOPIC
Bilirubin Urine: NEGATIVE
Glucose, UA: NEGATIVE mg/dL
Hgb urine dipstick: NEGATIVE
Ketones, ur: NEGATIVE mg/dL
Leukocytes,Ua: NEGATIVE
Nitrite: NEGATIVE
Protein, ur: NEGATIVE mg/dL
Specific Gravity, Urine: >1.03 — ABNORMAL HIGH (ref 1.005–1.030)
pH: 5.5 (ref 5.0–8.0)

## 2020-12-30 LAB — CBC WITH DIFFERENTIAL/PLATELET
Abs Immature Granulocytes: 0.01 10*3/uL (ref 0.00–0.07)
Basophils Absolute: 0 10*3/uL (ref 0.0–0.1)
Basophils Relative: 1 %
Eosinophils Absolute: 0.2 10*3/uL (ref 0.0–0.5)
Eosinophils Relative: 4 %
HCT: 43.1 % (ref 39.0–52.0)
Hemoglobin: 14.4 g/dL (ref 13.0–17.0)
Immature Granulocytes: 0 %
Lymphocytes Relative: 41 %
Lymphs Abs: 1.8 10*3/uL (ref 0.7–4.0)
MCH: 29.7 pg (ref 26.0–34.0)
MCHC: 33.4 g/dL (ref 30.0–36.0)
MCV: 88.9 fL (ref 80.0–100.0)
Monocytes Absolute: 0.7 10*3/uL (ref 0.1–1.0)
Monocytes Relative: 15 %
Neutro Abs: 1.7 10*3/uL (ref 1.7–7.7)
Neutrophils Relative %: 39 %
Platelets: 276 10*3/uL (ref 150–400)
RBC: 4.85 MIL/uL (ref 4.22–5.81)
RDW: 12.9 % (ref 11.5–15.5)
WBC: 4.4 10*3/uL (ref 4.0–10.5)
nRBC: 0 % (ref 0.0–0.2)

## 2020-12-30 LAB — TROPONIN I (HIGH SENSITIVITY): Troponin I (High Sensitivity): 5 ng/L (ref <18)

## 2020-12-30 NOTE — ED Notes (Signed)
Pt provided discharge instructions and prescription information. Pt was given the opportunity to ask questions and questions were answered. Discharge signature not obtained in the setting of the COVID-19 pandemic in order to reduce high touch surfaces.  ° °

## 2020-12-30 NOTE — Discharge Instructions (Signed)
Please read and follow all provided instructions.  Your diagnoses today include:  1. Generalized abdominal pain   2. Myalgia   3. Acute nonintractable headache, unspecified headache type     Tests performed today include: Blood cell counts (white, red, and platelets) Electrolytes  Kidney function test Liver function test Pancreas test (lipase) Urine test to check for infection - shows dehydration Cardiac enzyme - no sign of stress on heart EKG/Chest x-ray Vital signs. See below for your results today.   Medications prescribed:  None  Take any prescribed medications only as directed.  Home care instructions:  Follow any educational materials contained in this packet.  BE VERY CAREFUL not to take multiple medicines containing Tylenol (also called acetaminophen). Doing so can lead to an overdose which can damage your liver and cause liver failure and possibly death.   Follow-up instructions: Please follow-up with your primary care provider in the next 7 days for further evaluation of your symptoms.   Return instructions:  Please return to the Emergency Department if you experience worsening symptoms.  Please return if you have any other emergent concerns.  Additional Information:  Your vital signs today were: BP 125/87   Pulse 85   Temp 99 F (37.2 C) (Oral)   Resp 20   Ht 5\' 7"  (1.702 m)   Wt 89.8 kg   SpO2 93%   BMI 31.01 kg/m  If your blood pressure (BP) was elevated above 135/85 this visit, please have this repeated by your doctor within one month. --------------

## 2020-12-30 NOTE — ED Provider Notes (Signed)
[facility] EMERGENCY DEPARTMENT Provider Note   CSN: [id number] Arrival date & time: 12/30/20  1421     History Chief Complaint  Patient presents with   Abdominal Pain    Travis Chang is a 60 y.o. male.  Patient with no significant past medical history presents to the emergency department today for evaluation of body aches including chest pain, mild cough, abdominal pain.  Symptoms started 3 days ago.  He was seen at [facility] 2 days ago and had a negative COVID test.  No documented fevers but has been having intermittent sweats.  No ear pain, runny nose or sore throat.  He has had some "phlegm" in his throat.  He has had generalized abdominal pain with diarrhea.  No vomiting.  No urinary symptoms.  He has taken over-the-counter medications at home.  He states sick contact with COVID prior to onset.  No history of abdominal surgeries.      Past Medical History:  Diagnosis Date   Atypical chest pain 02/19/2017    Patient Active Problem List   Diagnosis Date Noted   Atypical chest pain 02/19/2017    Past Surgical History:  Procedure Laterality Date   kidney stones  2003   SHOULDER SURGERY Right        Family History  Problem Relation Age of Onset   Congestive Heart Failure Mother    Hypertension Mother    Diabetes Mother    Heart attack Father    Heart attack Sister    Diabetes Brother    Hypertension Brother    Hypertension Sister     Social History   Tobacco Use   Smoking status: Former   Smokeless tobacco: Never  Building services engineer Use: Never used  Substance Use Topics   Alcohol use: Not Currently   Drug use: No    Home Medications Prior to Admission medications   Medication Sig Start Date End Date Taking? Authorizing Provider  Multiple Vitamins-Minerals (ALIVE MENS ENERGY) TABS Take 1 tablet by mouth every morning. 03/30/17   Barrett, Joline Salt, PA-C    Allergies    Patient has no known allergies.  Review of Systems   Review  of Systems  Constitutional:  Positive for diaphoresis. Negative for chills, fatigue and fever.  HENT:  Negative for congestion, ear pain, rhinorrhea, sinus pressure and sore throat.   Eyes:  Negative for redness.  Respiratory:  Positive for cough. Negative for wheezing.   Cardiovascular:  Positive for chest pain.  Gastrointestinal:  Positive for abdominal pain. Negative for diarrhea, nausea and vomiting.  Genitourinary:  Negative for dysuria.  Musculoskeletal:  Positive for myalgias. Negative for neck stiffness.  Skin:  Negative for rash.  Neurological:  Positive for headaches.  Hematological:  Negative for adenopathy.   Physical Exam Updated Vital Signs BP (!) 146/92 (BP Location: Left Arm)   Pulse 88   Temp 99 F (37.2 C) (Oral)   Resp 18   Ht 5\' 7"  (1.702 m)   Wt 89.8 kg   SpO2 100%   BMI 31.01 kg/m   Physical Exam Vitals and nursing note reviewed.  Constitutional:      General: He is not in acute distress.    Appearance: He is well-developed.  HENT:     Head: Normocephalic and atraumatic.     Jaw: No trismus.     Right Ear: Tympanic membrane, ear canal and external ear normal.     Left Ear: Tympanic membrane, ear canal and external  ear normal.     Nose: Nose normal. No mucosal edema or rhinorrhea.     Mouth/Throat:     Mouth: Mucous membranes are not dry.     Pharynx: Uvula midline. No oropharyngeal exudate, posterior oropharyngeal erythema or uvula swelling.     Tonsils: No tonsillar abscesses.  Eyes:     General:        Right eye: No discharge.        Left eye: No discharge.     Conjunctiva/sclera: Conjunctivae normal.  Cardiovascular:     Rate and Rhythm: Normal rate and regular rhythm.     Heart sounds: Normal heart sounds.  Pulmonary:     Effort: Pulmonary effort is normal. No respiratory distress.     Breath sounds: Normal breath sounds. No wheezing or rales.  Abdominal:     Palpations: Abdomen is soft.     Tenderness: There is generalized abdominal  tenderness (Mild generalized tenderness).  Musculoskeletal:     Cervical back: Normal range of motion and neck supple.  Skin:    General: Skin is warm and dry.  Neurological:     Mental Status: He is alert.    ED Results / Procedures / Treatments   Labs (all labs ordered are listed, but only abnormal results are displayed) Labs Reviewed  COMPREHENSIVE METABOLIC PANEL - Abnormal; Notable for the following components:      Result Value   Glucose, Bld 123 (*)    BUN 22 (*)    All other components within normal limits  URINALYSIS, ROUTINE W REFLEX MICROSCOPIC - Abnormal; Notable for the following components:   Specific Gravity, Urine >1.030 (*)    All other components within normal limits  CBC WITH DIFFERENTIAL/PLATELET  LIPASE, BLOOD  TROPONIN I (HIGH SENSITIVITY)    ED ECG REPORT   Date: 12/30/2020  Rate: 97  Rhythm: normal sinus rhythm  QRS Axis: left  Intervals: normal  ST/T Wave abnormalities: nonspecific ST/T changes  Conduction Disutrbances:none  Narrative Interpretation:   Old EKG Reviewed: unchanged from 2018  I have personally reviewed the EKG tracing and agree with the computerized printout as noted.    Radiology No results found.  Procedures Procedures   Medications Ordered in ED Medications - No data to display  ED Course  I have reviewed the triage vital signs and the nursing notes.  Pertinent labs & imaging results that were available during my care of the patient were reviewed by me and considered in my medical decision making (see chart for details).  Patient seen and examined. Work-up initiated.  Overall symptoms are nonspecific.  Will check abdominal pain labs and chest x-ray.  EKG is abnormal, but not appreciably different from 2018.  Vital signs reviewed and are as follows: BP (!) 146/92 (BP Location: Left Arm)   Pulse 88   Temp 99 F (37.2 C) (Oral)   Resp 18   Ht 5\' 7"  (1.702 m)   Wt 89.8 kg   SpO2 100%   BMI 31.01 kg/m   7:16  PM patient reassessed, exam unchanged.  We reviewed results which are overall reassuring.  This includes chest x-ray and cardiac enzymes.  We discussed symptomatic care at this time.  Encouraged to rest over the next several days.  Patient encouraged to return for recheck if symptoms worsen. Patient counseled on supportive care and s/s to return including worsening symptoms, persistent fever, persistent vomiting, or if they have any other concerns. Urged to see PCP if symptoms persist for  more than 3 days. Patient verbalizes understanding and agrees with plan.     MDM Rules/Calculators/A&P                           Patient with generalized body aches, abdominal pain, headaches.  Recent negative COVID test.  Work-up today including CBC, CMP, UA.  Given diaphoresis episodes, also evaluated with EKG, troponin x1, chest x-ray.  Work-up is reassuring.  Possible viral syndrome.  Patient appears well, nontoxic.  Will treat symptomatically at this time and have patient return with any worsening.  Encourage PCP follow-up as above.   Final Clinical Impression(s) / ED Diagnoses Final diagnoses:  Generalized abdominal pain  Myalgia  Acute nonintractable headache, unspecified headache type    Rx / DC Orders ED Discharge Orders     None        Renne Crigler, PA-C 12/30/20 1918    Tegeler, Canary Brim, MD 12/30/20 [phone]

## 2020-12-30 NOTE — ED Triage Notes (Signed)
Pt c/o abdominal pain described as aching and headaches since Thursday. Pt seen at [facility] for same and was tested for COVID. Patient reports COVID test was negative. Pain increases with laying down. Pt also reports diarrhea x 2 days.

## 2021-08-11 IMAGING — CR DG CHEST 2V
2 series · 2 of 2 positions shown · non-contrast
Comparison: Chest radiograph February 13, 2017.

CLINICAL DATA: Patient with abdominal pain.  Headaches.

EXAM:
CHEST - 2 VIEW

[w chest pa]
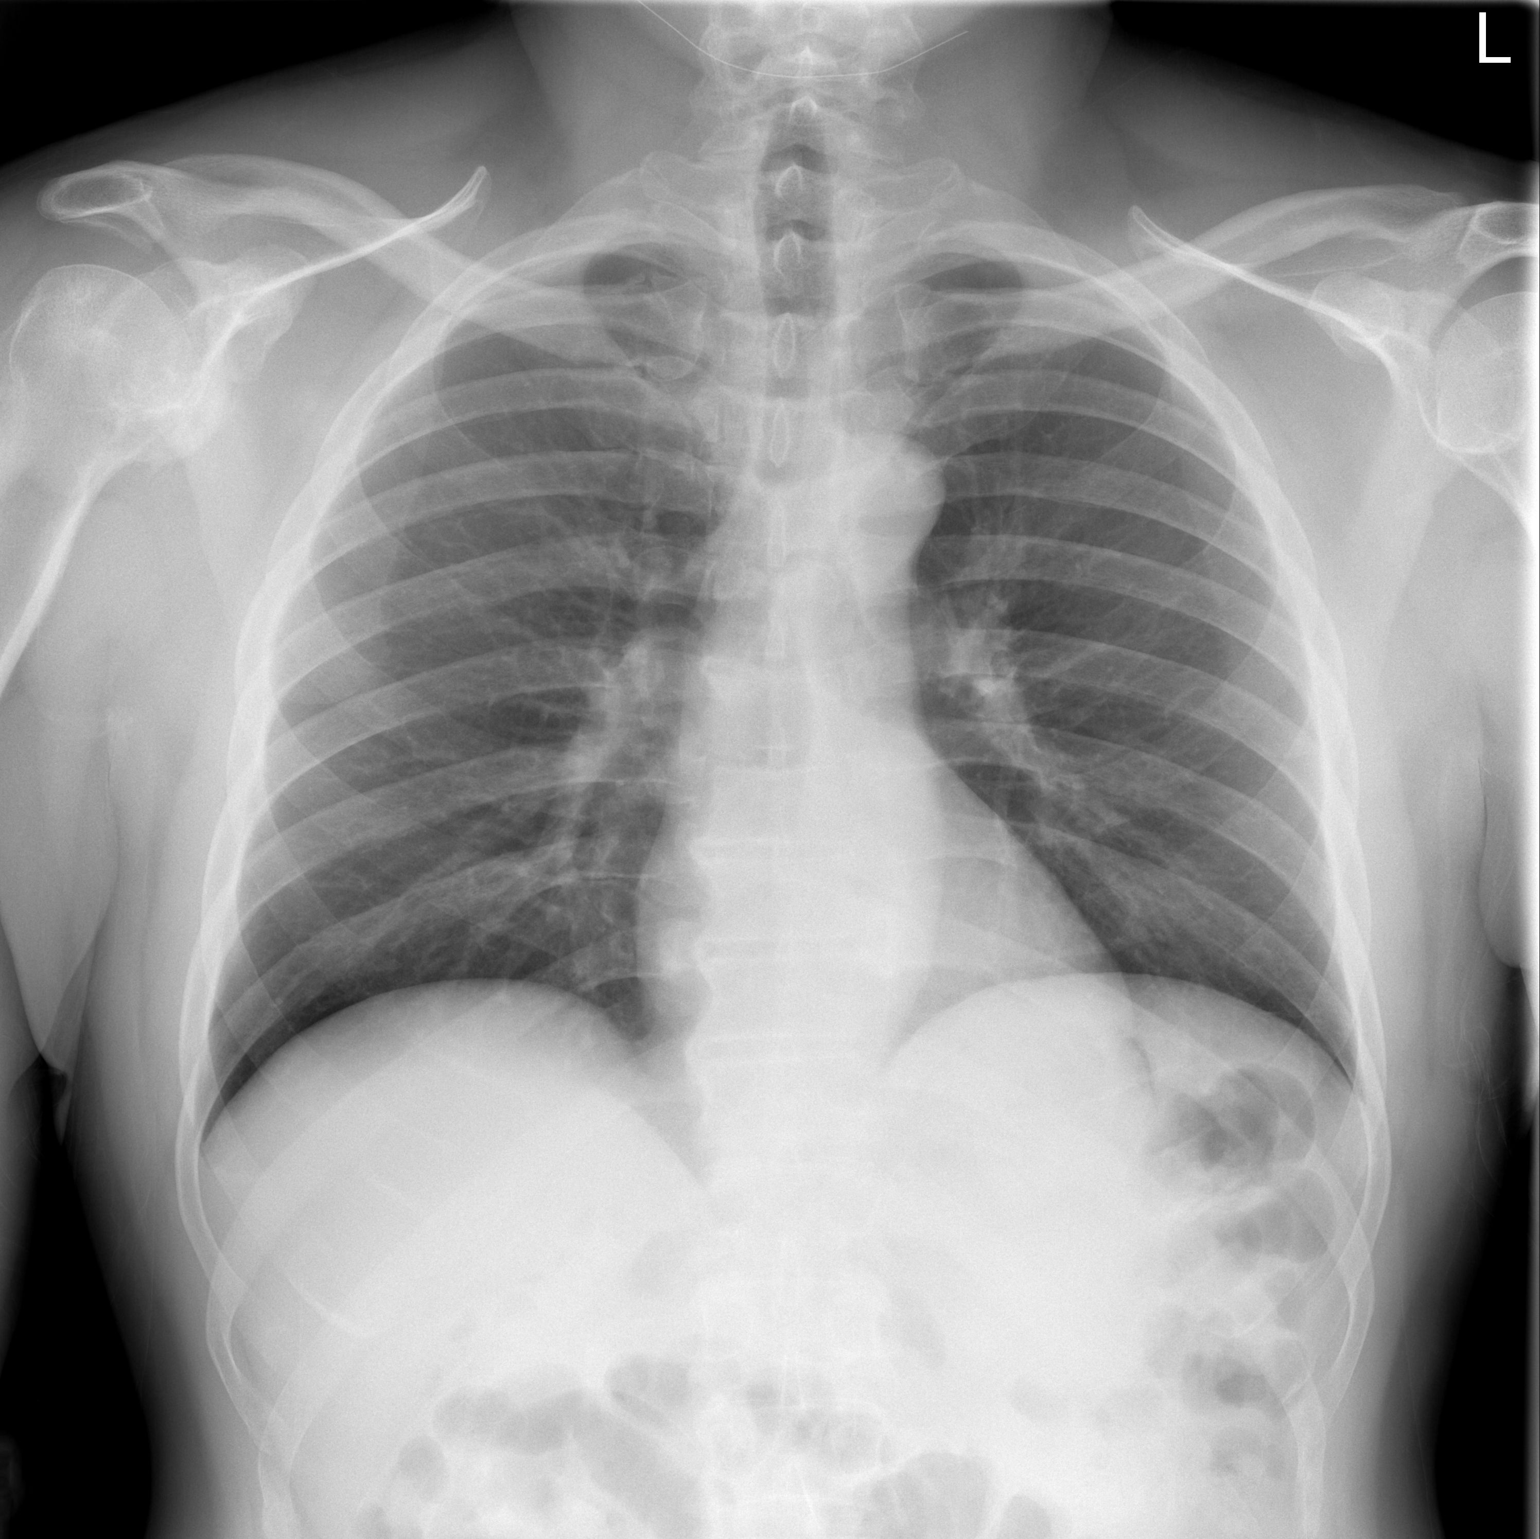

[w chest lat]
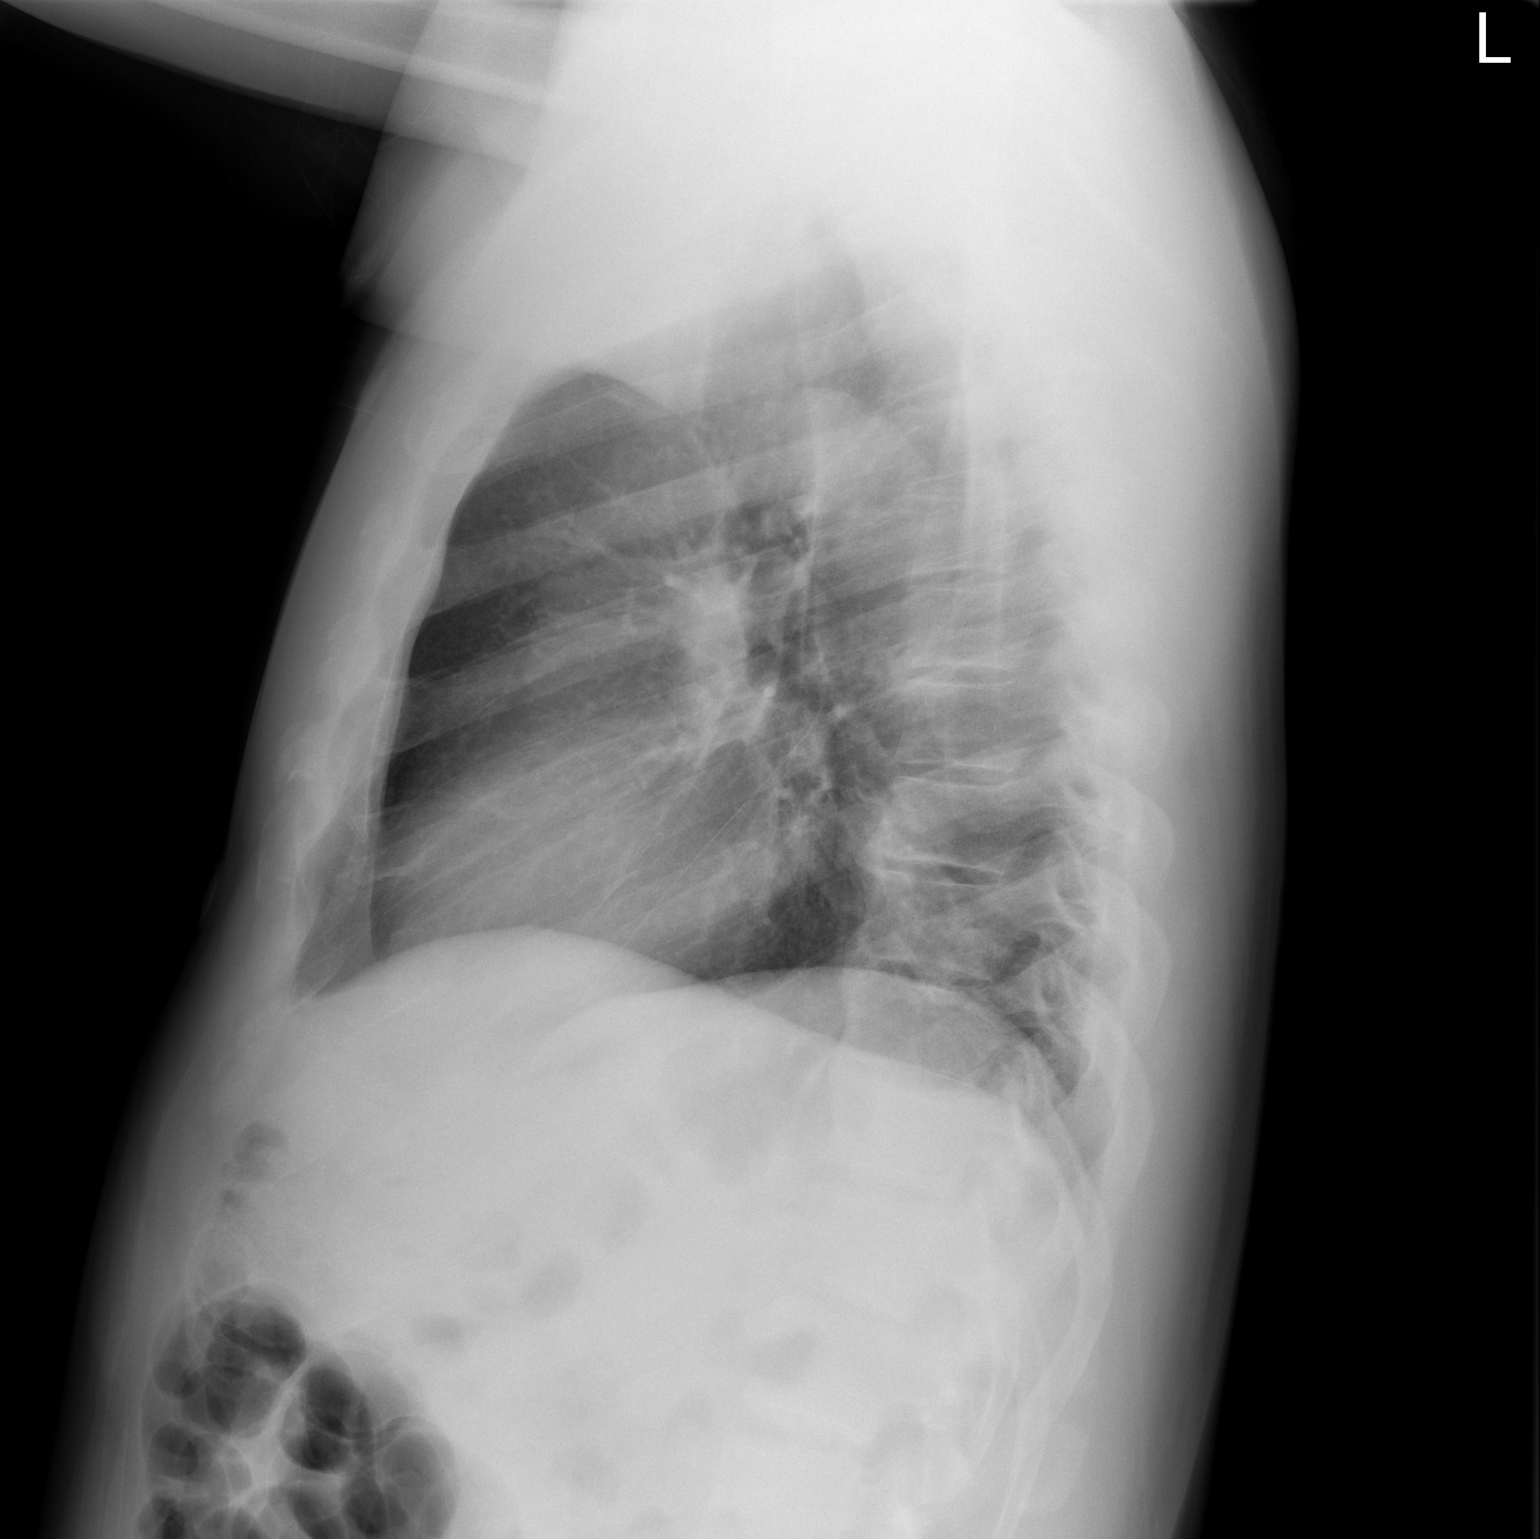

[2 of 2 positions shown; findings below may reference images not displayed]

FINDINGS: The heart size and mediastinal contours are within normal limits.
Both lungs are clear. The visualized skeletal structures are
unremarkable.
IMPRESSION: No active cardiopulmonary disease.

## 2022-02-07 ENCOUNTER — Encounter: Payer: Self-pay | Admitting: Emergency Medicine

## 2022-02-07 ENCOUNTER — Telehealth: Payer: Self-pay | Admitting: Family Medicine

## 2022-02-07 ENCOUNTER — Ambulatory Visit
Admission: EM | Admit: 2022-02-07 | Discharge: 2022-02-07 | Disposition: A | Discharge status: Home / Self Care | Payer: BC Managed Care – PPO

## 2022-02-07 ENCOUNTER — Ambulatory Visit (INDEPENDENT_AMBULATORY_CARE_PROVIDER_SITE_OTHER): Payer: BC Managed Care – PPO

## 2022-02-07 DIAGNOSIS — W19XXXA Unspecified fall, initial encounter: Secondary | ICD-10-CM | POA: Diagnosis not present

## 2022-02-07 DIAGNOSIS — M79632 Pain in left forearm: Secondary | ICD-10-CM | POA: Diagnosis not present

## 2022-02-07 DIAGNOSIS — M79602 Pain in left arm: Secondary | ICD-10-CM

## 2022-02-07 DIAGNOSIS — M79642 Pain in left hand: Secondary | ICD-10-CM | POA: Diagnosis not present

## 2022-02-07 NOTE — ED Triage Notes (Addendum)
Workers Comp - Pt states, " I was walking at work and tripped over a skid at [employer] on 02/06/22. I landed on  my left side on concrete. I now have pain to my left wrist, elbow & left hip. Pain to left arm increases when I turn my arm out" OTC meds ibuprofen  400 mg at 0900

## 2022-02-07 NOTE — ED Notes (Signed)
Urine drug screen completed in employer health prior to visit in Urgent Care.

## 2022-02-07 NOTE — Discharge Instructions (Addendum)
Advised patient of left hand/left forearm x-ray results with hard copy provided to patient.  Advised patient if left hand/left lower arm pain worsens and is unresolved please follow-up with [facility] for further evaluation. 8 AM-5 PM Monday through Friday.

## 2022-02-07 NOTE — Telephone Encounter (Signed)
Employee representative with patient today required specific work note for return to work and diagnosis ease in work note.  Note typed per patient/employee work representative request.

## 2022-02-07 NOTE — ED Provider Notes (Signed)
[facility]    CSN: [id number] Arrival date & time: 02/07/22  1355      History   Chief Complaint Chief Complaint  Patient presents with   Fall    HPI Travis Chang is a 61 y.o. male.   HPI pleasant 61 year old male presents with a work-related injury that occurred yesterday Thursday, 02/06/2022 at 145 pm per patient reports falling at work with left outstretched arm to break fall.  Reports immediate left elbow, left lower arm, and left hand pain.  PMH significant for atypical chest pain and kidney stones.  Past Medical History:  Diagnosis Date   Atypical chest pain 02/19/2017    Patient Active Problem List   Diagnosis Date Noted   Atypical chest pain 02/19/2017    Past Surgical History:  Procedure Laterality Date   kidney stones  2003   SHOULDER SURGERY Right        Home Medications    Prior to Admission medications   Medication Sig Start Date End Date Taking? Authorizing Provider  alendronate (FOSAMAX) 70 MG tablet Take 70 mg by mouth once a week. 01/30/22   [provider]  DULoxetine (CYMBALTA) 60 MG capsule Take 60 mg by mouth daily. 02/06/22   [provider]  Multiple Vitamins-Minerals (ALIVE MENS ENERGY) TABS Take 1 tablet by mouth every morning. 03/30/17   Barrett, Joline Salt, PA-C  predniSONE (DELTASONE) 10 MG tablet Take 10 mg by mouth daily. 01/19/22   [provider]    Family History Family History  Problem Relation Age of Onset   Congestive Heart Failure Mother    Hypertension Mother    Diabetes Mother    Heart attack Father    Heart attack Sister    Diabetes Brother    Hypertension Brother    Hypertension Sister     Social History Social History   Tobacco Use   Smoking status: Former    Types: Cigarettes   Smokeless tobacco: Never  Vaping Use   Vaping Use: Never used  Substance Use Topics   Alcohol use: Not Currently   Drug use: No     Allergies   Patient has no known  allergies.   Review of Systems Review of Systems  Musculoskeletal:        Left lower arm/left hand pain since 1:45 PM yesterday Thursday, 02/06/2022.  All other systems reviewed and are negative.    Physical Exam Triage Vital Signs ED Triage Vitals  Enc Vitals Group     BP 02/07/22 1504 125/81     Pulse Rate 02/07/22 1504 60     Resp 02/07/22 1504 16     Temp 02/07/22 1504 99.3 F (37.4 C)     Temp Source 02/07/22 1504 Oral     SpO2 02/07/22 1504 98 %     Weight 02/07/22 1508 195 lb (88.5 kg)     Height 02/07/22 1508 5\' 7"  (1.702 m)     Head Circumference --      Peak Flow --      Pain Score 02/07/22 1508 8     Pain Loc --      Pain Edu? --      Excl. in GC? --    No data found.  Updated Vital Signs BP 125/81 (BP Location: Right Arm)   Pulse 60   Temp 99.3 F (37.4 C) (Oral)   Resp 16   Ht 5\' 7"  (1.702 m)   Wt 195 lb (88.5 kg)   SpO2  98%   BMI 30.54 kg/m    Physical Exam Vitals and nursing note reviewed.  Constitutional:      Appearance: Normal appearance. He is normal weight.  HENT:     Head: Normocephalic and atraumatic.     Mouth/Throat:     Mouth: Mucous membranes are moist.     Pharynx: Oropharynx is clear.  Eyes:     Extraocular Movements: Extraocular movements intact.     Conjunctiva/sclera: Conjunctivae normal.     Pupils: Pupils are equal, round, and reactive to light.  Cardiovascular:     Rate and Rhythm: Normal rate and regular rhythm.     Pulses: Normal pulses.     Heart sounds: Normal heart sounds.  Pulmonary:     Effort: Pulmonary effort is normal.     Breath sounds: Normal breath sounds. No wheezing, rhonchi or rales.  Musculoskeletal:        General: Normal range of motion.     Cervical back: Normal range of motion and neck supple.     Comments: Left lower arm/left hand (inferior dorsal aspect): TTP with moderate soft tissue swelling noted limited range of motion with flexion/extension; neurovascular/neurosensory intact, grip 2/5,  brisk cap refill  Skin:    General: Skin is warm and dry.  Neurological:     General: No focal deficit present.     Mental Status: He is alert and oriented to person, place, and time.      UC Treatments / Results  Labs (all labs ordered are listed, but only abnormal results are displayed) Labs Reviewed - No data to display  EKG   Radiology DG Hand Complete Left  Result Date: 02/07/2022 CLINICAL DATA:  Left hand pain.  Injury. EXAM: LEFT HAND - COMPLETE 3+ VIEW COMPARISON:  None Available. FINDINGS: There is no evidence of fracture or dislocation. There is no evidence of arthropathy or other focal bone abnormality. Soft tissues are unremarkable. IMPRESSION: Negative. Electronically Signed   By: Darliss Cheney M.D.   On: 02/07/2022 15:37   DG Forearm Left  Result Date: 02/07/2022 CLINICAL DATA:  Fall with injury EXAM: LEFT FOREARM - 2 VIEW COMPARISON:  None Available. FINDINGS: There is no evidence of fracture or other focal bone lesions. Soft tissues are unremarkable. IMPRESSION: Negative. Electronically Signed   By: Jasmine Pang M.D.   On: 02/07/2022 15:37    Procedures Procedures (including critical care time)  Medications Ordered in UC Medications - No data to display  Initial Impression / Assessment and Plan / UC Course  I have reviewed the triage vital signs and the nursing notes.  Pertinent labs & imaging results that were available during my care of the patient were reviewed by me and considered in my medical decision making (see chart for details).     MDM: 1.  Fall initial encounter patient reports occurred at work/work-related injury on Thursday, 02/06/2022 at 1:45 PM; 2.  Left hand pain-left hand x-ray reveals above; 3.  Left arm pain-left arm x-ray reveals above. Advised patient of left hand/left forearm x-ray results with hard copy provided to patient.  Advised patient if left hand/left lower arm pain worsens and is unresolved please follow-up with [place]  occupational medicine for further evaluation. 8 AM-5 PM Monday through Friday.  Patient provided work note prior to discharge this afternoon. Patient discharged home, hemodynamically stable.  Final Clinical Impressions(s) / UC Diagnoses   Final diagnoses:  Fall, initial encounter  Left hand pain  Left arm pain  Discharge Instructions      Advised patient of left hand/left forearm x-ray results with hard copy provided to patient.  Advised patient if left hand/left lower arm pain worsens and is unresolved please follow-up with [place] occupational medicine for further evaluation. 8 AM-5 PM Monday through Friday.     ED Prescriptions   None    PDMP not reviewed this encounter.   Trevor Iha, FNP 02/07/22 [phone]

## 2024-02-11 LAB — AMB RESULTS CONSOLE CBG: Glucose: 217

## 2024-02-11 LAB — HEMOGLOBIN A1C: Hemoglobin A1C: 7.7

## 2024-04-05 NOTE — Progress Notes (Signed)
 The patient attended a screening event on 02/11/2024 where his screening results was 131/81, Fasting blood glucose 217, Hemoglobin A1c 7.7. At the event the patient noted she has [facility] [employer] and pt documented he started smoking cigarettes on 02/11/2024. Patient did not have any SDOH insecurities. Pt listed pcp as Dr.James Sullivan Barge. At the screening event pt was instructed to f/u with pcp at there next appt. Per chart review pt has a pcp and the last office visit was 03/04/2024 for diabetes. The pt BP was 121/77 on 03/04/2024. According to chart pt is currently on metFormin 500 mg and pt is to return in about 6 months (around 09/02/2024) for CPE with fasting labs prior requested by pcp. Chart review also indicates a future appt with pcp on 09/23/2023. No additional [facility] team support indicated at this time.
